# Patient Record
Sex: Female | Born: 2000 | Race: Black or African American | Hispanic: No | Marital: Single | Smoking: Never smoker
Health system: Southern US, Community
[De-identification: ages and names within clinical notes are randomized; demographics above are authoritative.]

## PROBLEM LIST (undated history)

## (undated) DIAGNOSIS — Z973 Presence of spectacles and contact lenses: Secondary | ICD-10-CM

## (undated) DIAGNOSIS — J45909 Unspecified asthma, uncomplicated: Secondary | ICD-10-CM

## (undated) HISTORY — PX: NO PAST SURGERIES: SHX2092

---
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
## 2020-03-09 ENCOUNTER — Other Ambulatory Visit: Payer: Self-pay

## 2020-03-09 ENCOUNTER — Ambulatory Visit: Payer: Self-pay | Attending: Internal Medicine

## 2020-03-09 DIAGNOSIS — Z23 Encounter for immunization: Secondary | ICD-10-CM

## 2020-03-09 NOTE — Progress Notes (Signed)
   Covid-19 Vaccination Clinic  Name:  AZURI BOZARD    MRN: [id number] DOB: 2001/05/31  03/09/2020  Ms. Minetti was observed post Covid-19 immunization for 15 minutes without incident. She was provided with Vaccine Information Sheet and instruction to access the V-Safe system.   Ms. Burdell was instructed to call 911 with any severe reactions post vaccine: Marland Kitchen Difficulty breathing  . Swelling of face and throat  . A fast heartbeat  . A bad rash all over body  . Dizziness and weakness   Immunizations Administered    Name Date Dose VIS Date Route   Pfizer COVID-19 Vaccine 03/09/2020  8:50 AM 0.3 mL 11/11/2019 Intramuscular   Manufacturer: [employer], [employer]   Lot: [id number]   NDC: [address]-2

## 2020-04-04 ENCOUNTER — Ambulatory Visit: Payer: Self-pay | Attending: Internal Medicine

## 2020-04-04 DIAGNOSIS — Z23 Encounter for immunization: Secondary | ICD-10-CM

## 2020-04-04 NOTE — Progress Notes (Signed)
   Covid-19 Vaccination Clinic  Name:  Tonya Allen    MRN: [id number] DOB: 12/14/2000  04/04/2020  [name] was observed post Covid-19 immunization for 15 minutes without incident. She was provided with Vaccine Information Sheet and instruction to access the V-Safe system.   [name] was instructed to call 911 with any severe reactions post vaccine: Marland Kitchen Difficulty breathing  . Swelling of face and throat  . A fast heartbeat  . A bad rash all over body  . Dizziness and weakness   Immunizations Administered    Name Date Dose VIS Date Route   Pfizer COVID-19 Vaccine 04/04/2020  8:36 AM 0.3 mL 01/25/2019 Intramuscular   Manufacturer: [employer], [employer]   Lot: [id number]   NDC: [address]-4
